# Patient Record
Sex: Female | Born: 2000 | Race: Black or African American | Hispanic: No | Marital: Single | Smoking: Never smoker
Health system: Southern US, Community
[De-identification: ages and names within clinical notes are randomized; demographics above are authoritative.]

## PROBLEM LIST (undated history)

## (undated) DIAGNOSIS — D571 Sickle-cell disease without crisis: Secondary | ICD-10-CM

---
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
## 2018-04-14 DIAGNOSIS — Z0000 Encounter for general adult medical examination without abnormal findings: Secondary | ICD-10-CM | POA: Diagnosis not present

## 2018-04-14 DIAGNOSIS — Z713 Dietary counseling and surveillance: Secondary | ICD-10-CM | POA: Diagnosis not present

## 2018-05-18 DIAGNOSIS — D5740 Sickle-cell thalassemia without crisis: Secondary | ICD-10-CM | POA: Diagnosis not present

## 2018-12-28 DIAGNOSIS — J019 Acute sinusitis, unspecified: Secondary | ICD-10-CM | POA: Diagnosis not present

## 2019-05-17 DIAGNOSIS — D571 Sickle-cell disease without crisis: Secondary | ICD-10-CM | POA: Diagnosis not present

## 2019-07-09 ENCOUNTER — Emergency Department: Payer: 59

## 2019-07-09 ENCOUNTER — Encounter: Payer: Self-pay | Admitting: Emergency Medicine

## 2019-07-09 ENCOUNTER — Other Ambulatory Visit: Payer: Self-pay

## 2019-07-09 ENCOUNTER — Emergency Department
Admission: EM | Admit: 2019-07-09 | Discharge: 2019-07-10 | Disposition: A | Discharge status: Home / Self Care | Payer: 59 | Attending: Emergency Medicine | Admitting: Emergency Medicine

## 2019-07-09 DIAGNOSIS — K5900 Constipation, unspecified: Secondary | ICD-10-CM | POA: Diagnosis not present

## 2019-07-09 DIAGNOSIS — R1031 Right lower quadrant pain: Secondary | ICD-10-CM | POA: Diagnosis not present

## 2019-07-09 HISTORY — DX: Sickle-cell disease without crisis: D57.1

## 2019-07-09 LAB — COMPREHENSIVE METABOLIC PANEL
ALT: 11 U/L (ref 0–44)
AST: 21 U/L (ref 15–41)
Albumin: 4.4 g/dL (ref 3.5–5.0)
Alkaline Phosphatase: 45 U/L (ref 38–126)
Anion gap: 8 (ref 5–15)
BUN: 12 mg/dL (ref 6–20)
CO2: 20 mmol/L — ABNORMAL LOW (ref 22–32)
Calcium: 9 mg/dL (ref 8.9–10.3)
Chloride: 110 mmol/L (ref 98–111)
Creatinine, Ser: 0.58 mg/dL (ref 0.44–1.00)
GFR calc Af Amer: >60 mL/min (ref >60)
GFR calc non Af Amer: >60 mL/min (ref >60)
Glucose, Bld: 93 mg/dL (ref 70–99)
Potassium: 3.7 mmol/L (ref 3.5–5.1)
Sodium: 138 mmol/L (ref 135–145)
Total Bilirubin: 1.3 mg/dL — ABNORMAL HIGH (ref 0.3–1.2)
Total Protein: 7.8 g/dL (ref 6.5–8.1)

## 2019-07-09 LAB — WET PREP, GENITAL
Clue Cells Wet Prep HPF POC: NONE SEEN
Sperm: NONE SEEN
Trich, Wet Prep: NONE SEEN
Yeast Wet Prep HPF POC: NONE SEEN

## 2019-07-09 LAB — CBC
HCT: 29.3 % — ABNORMAL LOW (ref 36.0–46.0)
Hemoglobin: 10.3 g/dL — ABNORMAL LOW (ref 12.0–15.0)
MCH: 27.6 pg (ref 26.0–34.0)
MCHC: 35.2 g/dL (ref 30.0–36.0)
MCV: 78.6 fL — ABNORMAL LOW (ref 80.0–100.0)
Platelets: 414 10*3/uL — ABNORMAL HIGH (ref 150–400)
RBC: 3.73 MIL/uL — ABNORMAL LOW (ref 3.87–5.11)
RDW: 20.6 % — ABNORMAL HIGH (ref 11.5–15.5)
WBC: 6.9 10*3/uL (ref 4.0–10.5)
nRBC: 15.6 % — ABNORMAL HIGH (ref 0.0–0.2)

## 2019-07-09 LAB — URINALYSIS, COMPLETE (UACMP) WITH MICROSCOPIC
Bilirubin Urine: NEGATIVE
Glucose, UA: NEGATIVE mg/dL
Hgb urine dipstick: NEGATIVE
Ketones, ur: NEGATIVE mg/dL
Leukocytes,Ua: NEGATIVE
Nitrite: NEGATIVE
Protein, ur: NEGATIVE mg/dL
Specific Gravity, Urine: 1.013 (ref 1.005–1.030)
pH: 5 (ref 5.0–8.0)

## 2019-07-09 LAB — POCT PREGNANCY, URINE: Preg Test, Ur: NEGATIVE

## 2019-07-09 MED ORDER — IOHEXOL 9 MG/ML PO SOLN
500.0000 mL | ORAL | Status: AC
  Administered 2019-07-09 (×2): 500 mL via ORAL

## 2019-07-09 MED ORDER — MORPHINE SULFATE (PF) 2 MG/ML IV SOLN
2.0000 mg | Freq: Once | INTRAVENOUS | Status: AC
  Administered 2019-07-09: 22:00:00 2 mg via INTRAVENOUS
  Filled 2019-07-09: qty 1

## 2019-07-09 MED ORDER — ONDANSETRON HCL 4 MG/2ML IJ SOLN
4.0000 mg | Freq: Once | INTRAMUSCULAR | Status: AC
  Administered 2019-07-09: 4 mg via INTRAVENOUS
  Filled 2019-07-09: qty 2

## 2019-07-09 MED ORDER — IOHEXOL 300 MG/ML  SOLN
75.0000 mL | Freq: Once | INTRAMUSCULAR | Status: AC | PRN
  Administered 2019-07-09: 75 mL via INTRAVENOUS

## 2019-07-09 MED ORDER — SODIUM CHLORIDE 0.9 % IV BOLUS
500.0000 mL | Freq: Once | INTRAVENOUS | Status: AC
  Administered 2019-07-09: 500 mL via INTRAVENOUS

## 2019-07-09 MED ORDER — KETOROLAC TROMETHAMINE 30 MG/ML IJ SOLN
15.0000 mg | Freq: Once | INTRAMUSCULAR | Status: AC
  Administered 2019-07-09: 15 mg via INTRAVENOUS
  Filled 2019-07-09: qty 1

## 2019-07-09 NOTE — ED Notes (Signed)
Pt drinking contrast in room at this time and in NAD.

## 2019-07-09 NOTE — ED Triage Notes (Signed)
Pt reports RLQ pain for the past week today pain has increased describes pain as sharp constant pain today. Pt reports normal BM last BM today. Reports some nausea,  no vomiting. Pt talks in complete sentences no respiratory distress noted

## 2019-07-09 NOTE — ED Provider Notes (Signed)
[facility] Emergency Department Provider Note   ____________________________________________   First MD Initiated Contact with Patient 07/09/19 2122     (approximate)  I have reviewed the triage vital signs and the nursing notes.   HISTORY  Chief Complaint Abdominal Pain (RLQ)    HPI Amanda Pace is a 18 y.o. female reports has been experiencing intermittent pain that seems to be slowly worsening however in the right lower abdomen for about a week.  Associated with decreased appetite.  No noted fever.  She is also experiencing some chills.  No chest pain or shortness of breath.  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  She not presently sexually active.  Denies pregnancy.  She has not noticed any unusual vaginal discharge or bleeding.  The pain is somewhat sharp, worsened with standing and walking.  Located primarily in the right lower abdomen   Patient verbally tells me it is okay to discuss her case and care today with her father Dr. Juliann Paresallwood  Past Medical History:  Diagnosis Date  . Sickle cell anemia (HCC)     There are no active problems to display for this patient.   History reviewed. No pertinent surgical history.  Prior to Admission medications   Not on File    Allergies Patient has no known allergies.  No family history on file.  Social History Social History   Tobacco Use  . Smoking status: Never Smoker  . Smokeless tobacco: Never Used  Substance Use Topics  . Alcohol use: Not Currently  . Drug use: Not Currently    Review of Systems Constitutional: No fever but some chills Eyes: No visual changes. ENT: No sore throat. Cardiovascular: Denies chest pain. Respiratory: Denies shortness of breath. Gastrointestinal: Pain primarily in the right lower abdomen Genitourinary: Negative for dysuria.  No bloody urine.  Denies  pregnancy. Musculoskeletal: Negative for back pain. Skin: Negative for rash. Neurological: Negative for headaches, areas of focal weakness or numbness.    ____________________________________________   PHYSICAL EXAM:  VITAL SIGNS: ED Triage Vitals  Enc Vitals Group     BP 07/09/19 1949 126/77     Pulse Rate 07/09/19 1949 84     Resp 07/09/19 1949 20     Temp 07/09/19 1949 (!) 97.5 F (36.4 C)     Temp Source 07/09/19 1949 Oral     SpO2 07/09/19 1949 100 %     Weight 07/09/19 1950 119 lb (54 kg)     Height 07/09/19 1950 5\' 4"  (1.626 m)     Head Circumference --      Peak Flow --      Pain Score 07/09/19 1949 7     Pain Loc --      Pain Edu? --      Excl. in GC? --     Constitutional: Alert and oriented.  Appears somewhat uncomfortable, reports she is having pretty significant pain located in the right lower abdomen.  For which is much worse if she moves Eyes: Conjunctivae are normal. Head: Atraumatic. Nose: No congestion/rhinnorhea. Mouth/Throat: Mucous membranes are moist. Neck: No stridor.  Cardiovascular: Normal rate, regular rhythm. Grossly normal heart sounds.  Good peripheral circulation. Respiratory: Normal respiratory effort.  No retractions. Lungs CTAB. Gastrointestinal: Soft and nontender in the upper abdomen, negative Murphy.  Reports pain in the right lower quadrant especially in the region McBurney's point, slight  tenderness left lower quadrant.  There is some tenderness to percussion in the right lower quadrant. No distention. Musculoskeletal: No lower extremity tenderness nor edema. Neurologic:  Normal speech and language. No gross focal neurologic deficits are appreciated.  Skin:  Skin is warm, dry and intact. No rash noted. Psychiatric: Mood and affect are normal. Speech and behavior are normal.  ____________________________________________   LABS (all labs ordered are listed, but only abnormal results are displayed)  Labs Reviewed  WET PREP,  GENITAL - Abnormal; Notable for the following components:      Result Value   WBC, Wet Prep HPF POC FEW (*)    All other components within normal limits  COMPREHENSIVE METABOLIC PANEL - Abnormal; Notable for the following components:   CO2 20 (*)    Total Bilirubin 1.3 (*)    All other components within normal limits  CBC - Abnormal; Notable for the following components:   RBC 3.73 (*)    Hemoglobin 10.3 (*)    HCT 29.3 (*)    MCV 78.6 (*)    RDW 20.6 (*)    Platelets 414 (*)    nRBC 15.6 (*)    All other components within normal limits  URINALYSIS, COMPLETE (UACMP) WITH MICROSCOPIC - Abnormal; Notable for the following components:   Color, Urine YELLOW (*)    APPearance CLEAR (*)    Bacteria, UA RARE (*)    All other components within normal limits  GC/CHLAMYDIA PROBE AMP  PATHOLOGIST SMEAR REVIEW  RETICULOCYTES  POC URINE PREG, ED  POCT PREGNANCY, URINE   ____________________________________________  EKG   ____________________________________________  RADIOLOGY  CT pending at time of signout to Dr. Dolores Frame ____________________________________________   PROCEDURES  Procedure(s) performed: None  Procedures  Critical Care performed: No  ____________________________________________   INITIAL IMPRESSION / ASSESSMENT AND PLAN / ED COURSE  Pertinent labs & imaging results that were available during my care of the patient were reviewed by me and considered in my medical decision making (see chart for details).   Differential diagnosis includes but is not limited to, abdominal perforation, aortic dissection, cholecystitis, appendicitis, diverticulitis, colitis, esophagitis/gastritis, kidney stone, pyelonephritis, urinary tract infection, etc. All are considered in decision and treatment plan. Based upon the patient's presentation and risk factors, and negative pregnancy test, will proceed with CT scan.  Her decreased appetite, worsening with movement, chills, and focal  right lower quadrant pain are concerning for possible appendicitis.  Also consider ovarian pathology or other acute intra-abdominal pathology, but the patient does not appear to have significant risk factors for STD. Neg preg test.   Clinical Course as of Jul 08 2345  Fri Jul 09, 2019  2155 Pelvic examination performed with nurse Lurena Joiner.  Patient unable to have a full speculum exam, she reports she has been sexually active in the past but none recently.  She was unable to tolerate pressure of speculum insertion, only able to visualize the proximal one third of the vagina which appeared to be normal in nature.  No purulence or bleeding noted.  No digital examination performed, patient however does report she is not been sexually active with anyone for a long period of time.   [MQ]  2156 Reports her pain is much better after morphine and Toradol.  She is awake and alert.  Agreeable with moving forward with CT scan.   [MQ]    Clinical Course User Index [MQ] Sharyn Creamer, MD   ----------------------------------------- 11:43 PM on 07/09/2019 -----------------------------------------  Patient had CT scan.  Ongoing care assigned to Dr. Beather Arbour.  Follow-up on CT scan, patient experiencing increasing right lower quadrant pain.  Evaluate for etiology, if CT unrevealing would recommend obtaining transvaginal ultrasound to evaluate for adnexal or ovarian pathology as possible cause.  Dr. Beather Arbour will follow-up on CT, just added retic count, reassessment and possible need for ultrasound pending results.  Of note, did review care everywhere, the patient's hemoglobin of 10 today appears consistent with her known anemia which is secondary to sickle cell disease.  Amanda Pace was evaluated in Emergency Department on 07/09/2019 for the symptoms described in the history of present illness. She was evaluated in the context of the global COVID-19 pandemic, which necessitated consideration that the patient might be at  risk for infection with the SARS-CoV-2 virus that causes COVID-19. Institutional protocols and algorithms that pertain to the evaluation of patients at risk for COVID-19 are in a state of rapid change based on information released by regulatory bodies including the CDC and federal and state organizations. These policies and algorithms were followed during the patient's care in the ED.  No notable COVID like symptoms.  ____________________________________________   FINAL CLINICAL IMPRESSION(S) / ED DIAGNOSES  Final diagnoses:  Right lower quadrant abdominal pain        Note:  This document was prepared using Dragon voice recognition software and may include unintentional dictation errors       Delman Kitten, MD 07/09/19 2347

## 2019-07-09 NOTE — ED Notes (Signed)
Pt to CT at this time.

## 2019-07-09 NOTE — ED Notes (Signed)
Pt reports she has sickle cell anemia, and for pain took tylenol and ibuprofen 2 hrs prior to arrival

## 2019-07-10 LAB — RETICULOCYTES
Immature Retic Fract: 17.3 % — ABNORMAL HIGH (ref 2.3–15.9)
RBC.: 2.8 MIL/uL — ABNORMAL LOW (ref 3.87–5.11)
Retic Count, Absolute: 150.5 10*3/uL (ref 19.0–186.0)
Retic Ct Pct: 5.4 % — ABNORMAL HIGH (ref 0.4–3.1)

## 2019-07-10 MED ORDER — ONDANSETRON 4 MG PO TBDP: 4.0000 mg | ORAL_TABLET | Freq: Three times a day (TID) | ORAL | 0 refills | Status: AC | PRN

## 2019-07-10 MED ORDER — DICYCLOMINE HCL 20 MG PO TABS: 20.0000 mg | ORAL_TABLET | Freq: Four times a day (QID) | ORAL | 0 refills | Status: AC | PRN

## 2019-07-10 MED ORDER — LACTULOSE 10 GM/15ML PO SOLN: 20.0000 g | Freq: Every day | ORAL | 0 refills | Status: AC | PRN

## 2019-07-10 NOTE — Discharge Instructions (Signed)
1.  You may take Bentyl and Zofran as needed for abdominal discomfort and nausea. 2.  You may take Lactulose as needed for bowel movements. 3.  I recommend these over-the-counter medications to regulate daily bowel movements: MiraLAX Stool softener such as Colace Fiber Plenty of fluids 4.  Return to the ER for worsening symptoms, persistent vomiting, fever or other concerns.

## 2019-07-10 NOTE — ED Provider Notes (Signed)
-----------------------------------------   12:36 AM on 07/10/2019 -----------------------------------------  CT abdomen/pelvis interpreted per Dr. Marguerita Merles:  1. Extensive fecalization of the distal small bowel contents and a  large volume of stool present throughout the colon, consistent with  constipation. No evidence of bowel obstruction.  2. No other acute abnormality in the abdomen or pelvis, specifically  normal air-filled appendix is present in the right lower quadrant.  3. Numerous rounded lesions in the spleen hypoattenuating relative  to the background parenchyma. Differential diagnosis for these  lesions is extensive including developmental, infectious, malignant  and metastatic etiologies. Given that patient has a history of  sickle cell anemia such an appearance can be seen in the setting of  hemosiderosis particularly if the patient has undergone prior  transfusion therapy. If further imaging is felt to be clinically  warranted, contrast-enhanced MRI could be obtained    Updated patient and her father of CT results.  She is feeling significantly better.  States this is not her typical sickle cell pain crisis which usually she has pain in her limbs.  Her father agrees.  Pelvis adequately assessed on CT scan; do not feel transvaginal ultrasound would add to the work-up.  Reviewed CT scan scout film together.  Will place patient on Lactulose, Bentyl and Zofran as needed.  Strict return precautions given.  Both verbalized understanding agree with plan of care.   Paulette Blanch, MD 07/10/19 [phone]

## 2019-07-12 ENCOUNTER — Telehealth: Payer: Self-pay | Admitting: Emergency Medicine

## 2019-07-12 LAB — PATHOLOGIST SMEAR REVIEW

## 2019-07-12 NOTE — Telephone Encounter (Signed)
Called pateint to inform that hematology tests results and would like her to inform her hematologist.  I left a message asking her to call me.

## 2019-07-13 LAB — GC/CHLAMYDIA PROBE AMP
Chlamydia trachomatis, NAA: NEGATIVE
Neisseria Gonorrhoeae by PCR: NEGATIVE

## 2019-08-18 DIAGNOSIS — Z23 Encounter for immunization: Secondary | ICD-10-CM | POA: Diagnosis not present

## 2019-08-18 DIAGNOSIS — D5742 Sickle-cell thalassemia beta zero without crisis: Secondary | ICD-10-CM | POA: Diagnosis not present

## 2019-08-25 DIAGNOSIS — Z6820 Body mass index (BMI) 20.0-20.9, adult: Secondary | ICD-10-CM | POA: Diagnosis not present

## 2019-08-25 DIAGNOSIS — Z0000 Encounter for general adult medical examination without abnormal findings: Secondary | ICD-10-CM | POA: Diagnosis not present

## 2019-08-25 DIAGNOSIS — D571 Sickle-cell disease without crisis: Secondary | ICD-10-CM | POA: Diagnosis not present

## 2019-08-25 DIAGNOSIS — Z713 Dietary counseling and surveillance: Secondary | ICD-10-CM | POA: Diagnosis not present

## 2019-08-25 DIAGNOSIS — Z23 Encounter for immunization: Secondary | ICD-10-CM | POA: Diagnosis not present

## 2019-11-24 DIAGNOSIS — Z3009 Encounter for other general counseling and advice on contraception: Secondary | ICD-10-CM | POA: Diagnosis not present

## 2019-11-24 DIAGNOSIS — D5742 Sickle-cell thalassemia beta zero without crisis: Secondary | ICD-10-CM | POA: Diagnosis not present

## 2019-12-20 DIAGNOSIS — D5742 Sickle-cell thalassemia beta zero without crisis: Secondary | ICD-10-CM | POA: Diagnosis not present

## 2020-01-11 DIAGNOSIS — Z3009 Encounter for other general counseling and advice on contraception: Secondary | ICD-10-CM | POA: Diagnosis not present

## 2020-01-11 DIAGNOSIS — R69 Illness, unspecified: Secondary | ICD-10-CM | POA: Diagnosis not present

## 2020-01-11 DIAGNOSIS — N942 Vaginismus: Secondary | ICD-10-CM | POA: Diagnosis not present

## 2020-01-11 DIAGNOSIS — Z113 Encounter for screening for infections with a predominantly sexual mode of transmission: Secondary | ICD-10-CM | POA: Diagnosis not present

## 2020-01-11 DIAGNOSIS — Z975 Presence of (intrauterine) contraceptive device: Secondary | ICD-10-CM | POA: Diagnosis not present

## 2020-01-14 DIAGNOSIS — Z23 Encounter for immunization: Secondary | ICD-10-CM | POA: Diagnosis not present

## 2020-01-31 DIAGNOSIS — Z30017 Encounter for initial prescription of implantable subdermal contraceptive: Secondary | ICD-10-CM | POA: Diagnosis not present

## 2020-02-04 DIAGNOSIS — Z23 Encounter for immunization: Secondary | ICD-10-CM | POA: Diagnosis not present

## 2020-03-01 DIAGNOSIS — R6889 Other general symptoms and signs: Secondary | ICD-10-CM | POA: Diagnosis not present

## 2020-03-01 DIAGNOSIS — D5742 Sickle-cell thalassemia beta zero without crisis: Secondary | ICD-10-CM | POA: Diagnosis not present

## 2020-03-08 DIAGNOSIS — L814 Other melanin hyperpigmentation: Secondary | ICD-10-CM | POA: Diagnosis not present

## 2020-08-11 DIAGNOSIS — Z23 Encounter for immunization: Secondary | ICD-10-CM | POA: Diagnosis not present

## 2020-09-19 DIAGNOSIS — D5742 Sickle-cell thalassemia beta zero without crisis: Secondary | ICD-10-CM | POA: Diagnosis not present

## 2021-02-20 DIAGNOSIS — Z789 Other specified health status: Secondary | ICD-10-CM | POA: Diagnosis not present

## 2021-02-20 DIAGNOSIS — D5742 Sickle-cell thalassemia beta zero without crisis: Secondary | ICD-10-CM | POA: Diagnosis not present

## 2021-02-20 DIAGNOSIS — E538 Deficiency of other specified B group vitamins: Secondary | ICD-10-CM | POA: Diagnosis not present

## 2021-02-20 DIAGNOSIS — E559 Vitamin D deficiency, unspecified: Secondary | ICD-10-CM | POA: Diagnosis not present
